# Patient Record
Sex: Female | Born: 1965 | Race: White | Hispanic: No | Marital: Married | Smoking: Current every day smoker
Health system: Southern US, Community
[De-identification: ages and names within clinical notes are randomized; demographics above are authoritative.]

## PROBLEM LIST (undated history)

## (undated) ENCOUNTER — Emergency Department: Payer: Self-pay

---

## 2003-10-17 ENCOUNTER — Emergency Department (HOSPITAL_COMMUNITY): Admission: EM | Admit: 2003-10-17 | Discharge: 2003-10-18 | Payer: Self-pay | Admitting: *Deleted

## 2004-12-05 HISTORY — PX: TOTAL VAGINAL HYSTERECTOMY: SHX2548

## 2004-12-15 ENCOUNTER — Observation Stay (HOSPITAL_COMMUNITY): Admission: RE | Admit: 2004-12-15 | Discharge: 2004-12-16 | Payer: Self-pay | Admitting: Obstetrics & Gynecology

## 2006-07-18 ENCOUNTER — Ambulatory Visit: Payer: Self-pay | Admitting: Gastroenterology

## 2006-08-24 ENCOUNTER — Ambulatory Visit (HOSPITAL_COMMUNITY): Admission: RE | Admit: 2006-08-24 | Discharge: 2006-08-24 | Payer: Self-pay | Admitting: Obstetrics and Gynecology

## 2006-09-29 ENCOUNTER — Ambulatory Visit: Payer: Self-pay | Admitting: Gastroenterology

## 2007-01-05 ENCOUNTER — Ambulatory Visit (HOSPITAL_COMMUNITY): Admission: RE | Admit: 2007-01-05 | Discharge: 2007-01-05 | Payer: Self-pay | Admitting: Gastroenterology

## 2007-01-05 ENCOUNTER — Encounter (INDEPENDENT_AMBULATORY_CARE_PROVIDER_SITE_OTHER): Payer: Self-pay | Admitting: Specialist

## 2007-07-15 IMAGING — US US ABDOMEN COMPLETE
1 series · 14 of 25 positions shown · non-contrast
Comparison: None.

CLINICAL DATA: Right upper quadrant pain.
 ABDOMEN ULTRASOUND:
TECHNIQUE: Complete abdominal ultrasound examination was performed including evaluation of the liver, gallbladder, bile ducts, pancreas, kidneys, spleen, IVC, and abdominal aorta.

[Series 1: unknown · 0.27mm/px · 14 of 75 slices shown]
[im 1/75]
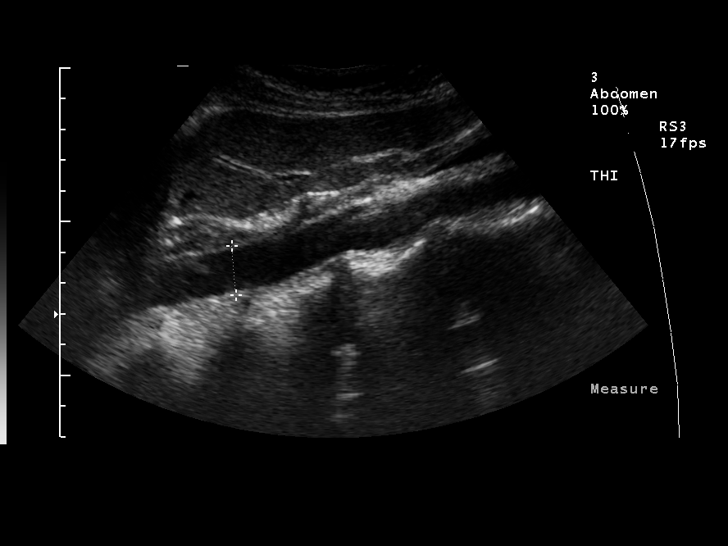
[im 7/75]
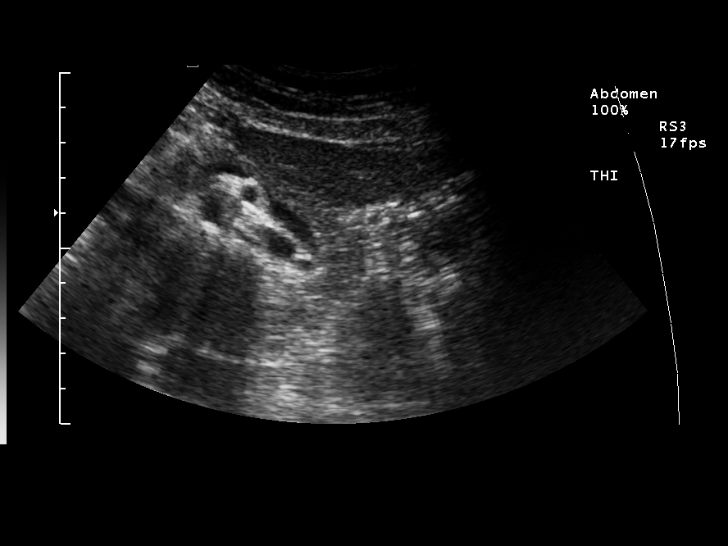
[im 13/75]
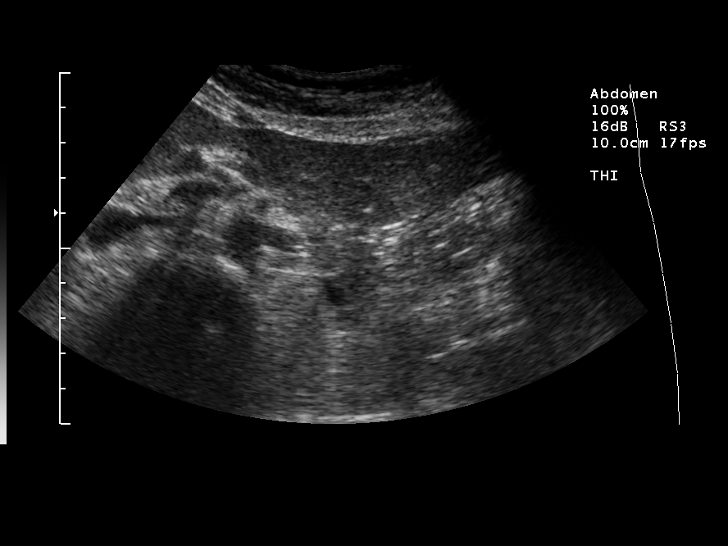
[im 19/75]
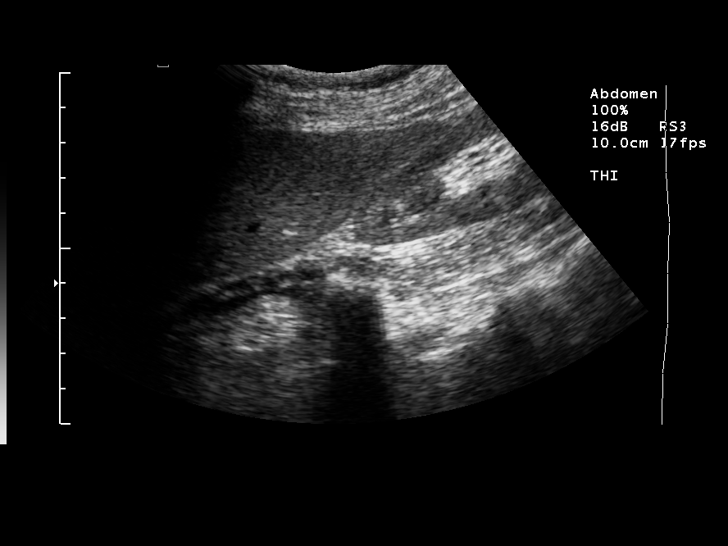
[im 25/75]
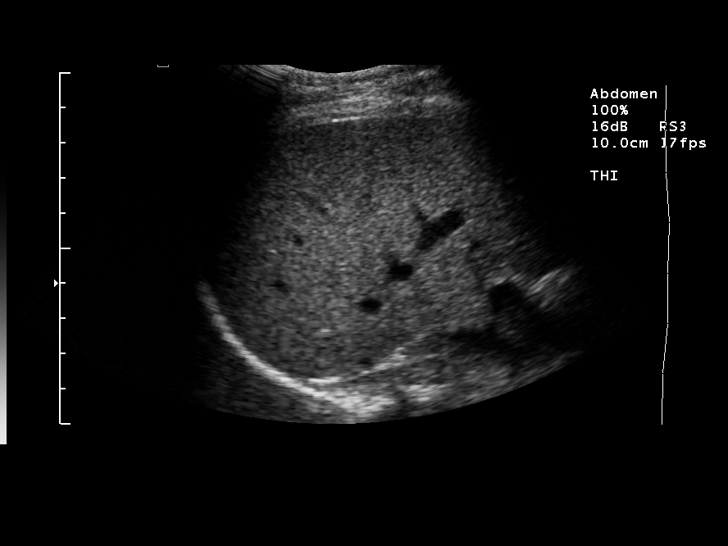
[im 28/75]
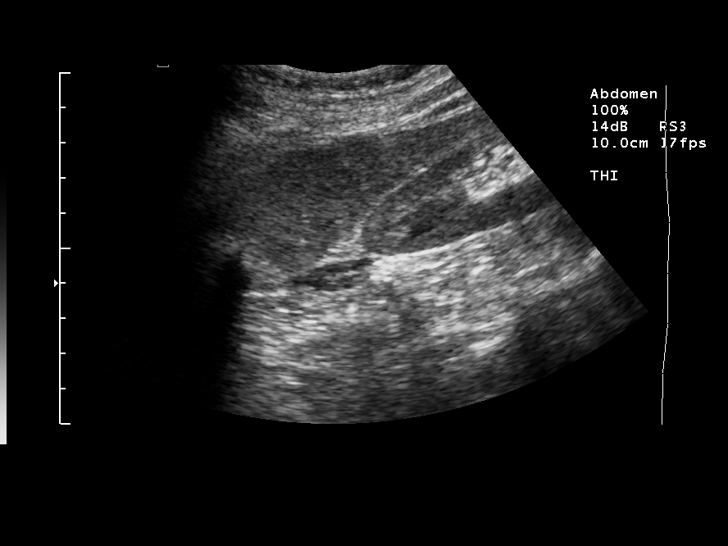
[im 34/75]
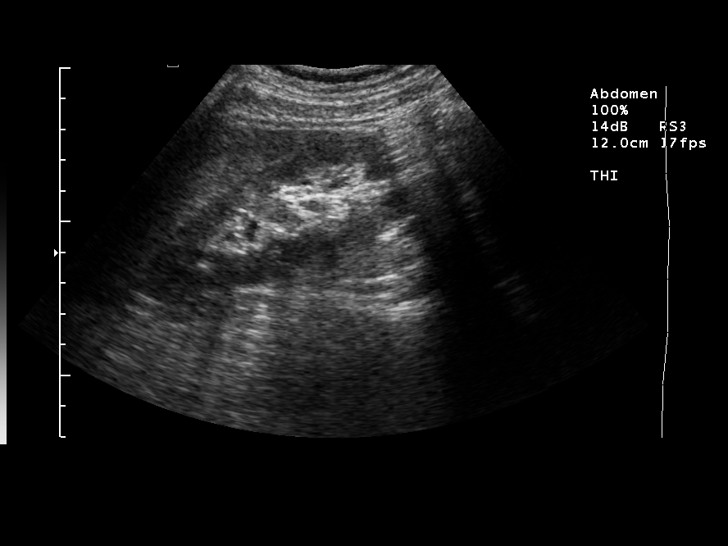
[im 41/75]
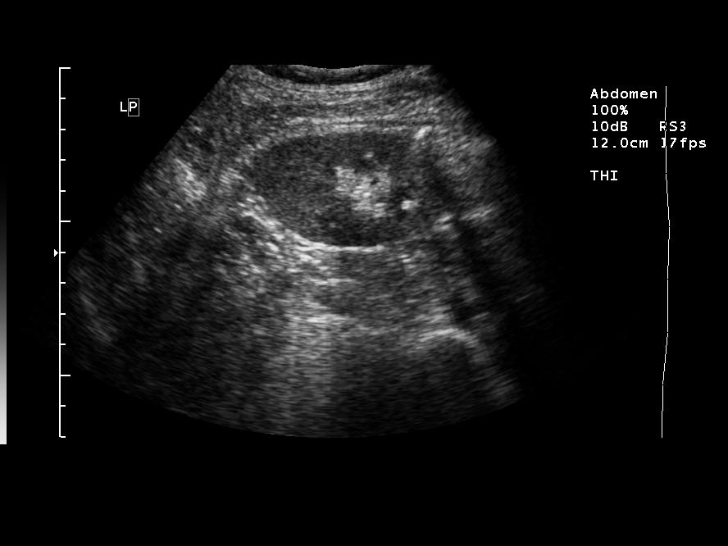
[im 47/75]
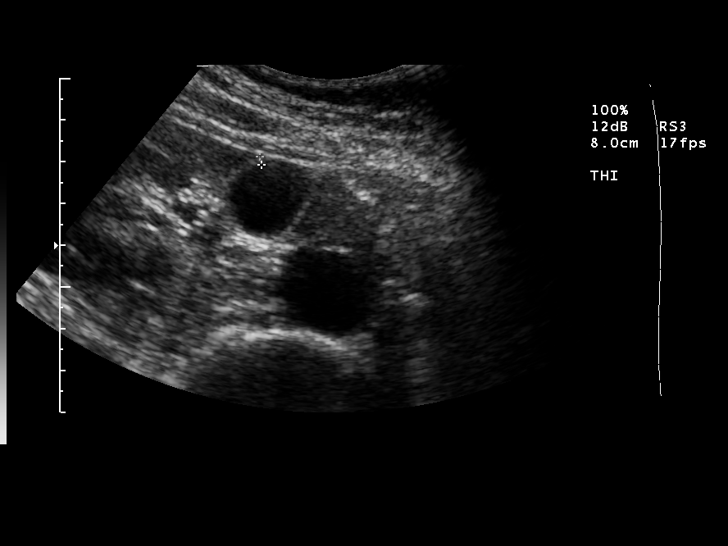
[im 50/75]
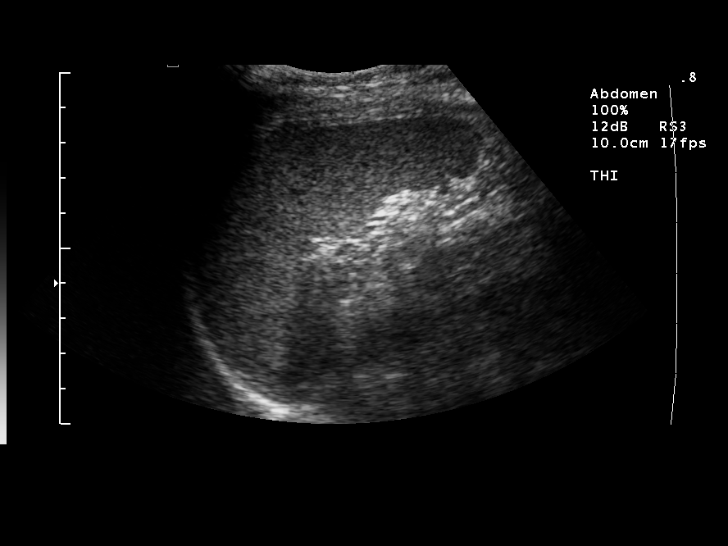
[im 56/75]
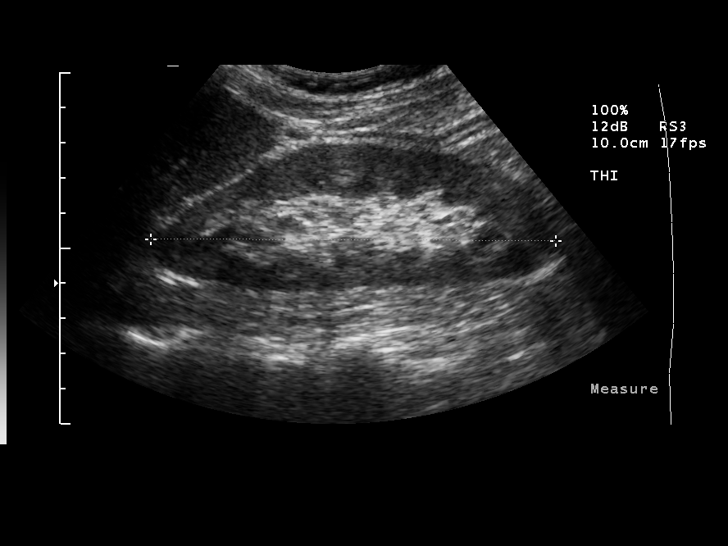
[im 62/75]
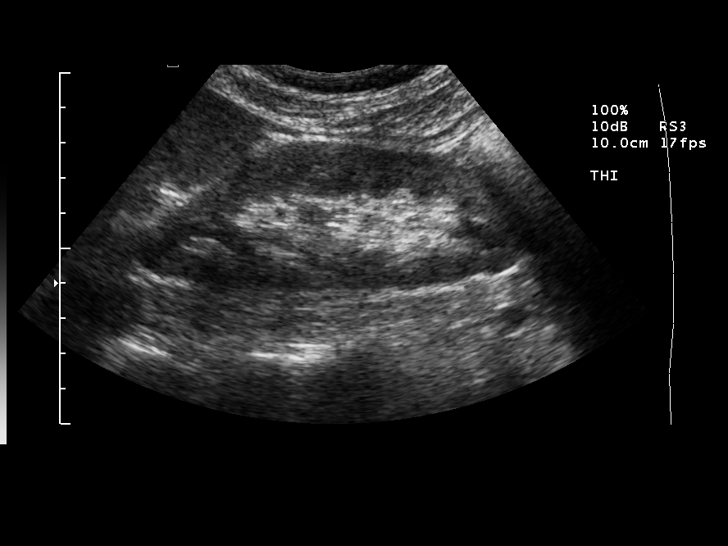
[im 68/75]
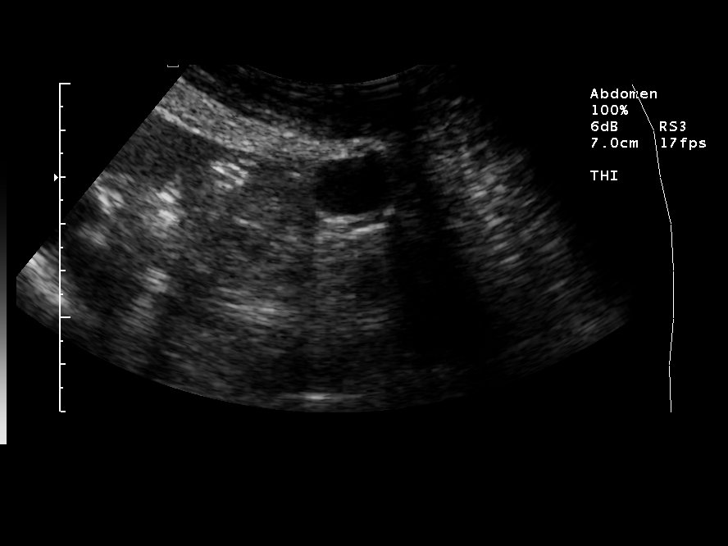
[im 75/75]
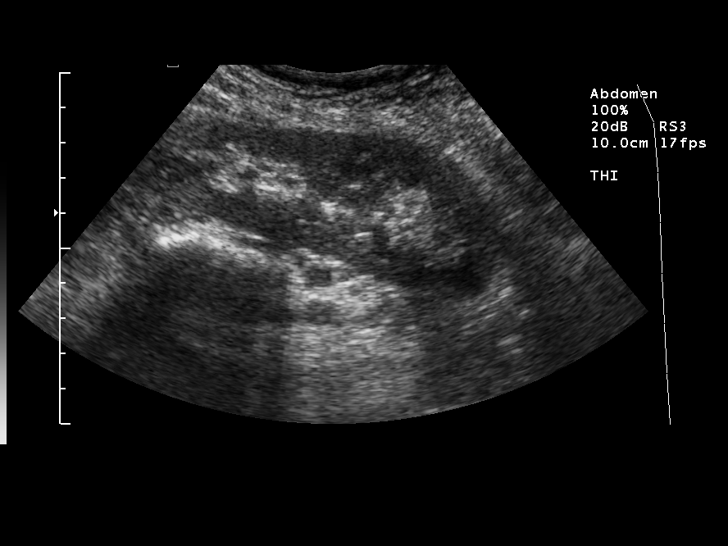

[14 of 25 positions shown; findings below may reference images not displayed]

FINDINGS: Gallbladder and biliary tree normal.  Common duct is 2.2 mm.  Liver, pancreas, and spleen normal.  Kidney size normal without stones or obstruction.  IVC and aorta normal.  No ascites.
IMPRESSION: Normal exam.

## 2010-12-23 ENCOUNTER — Ambulatory Visit
Admission: RE | Admit: 2010-12-23 | Discharge: 2010-12-23 | Payer: Self-pay | Source: Home / Self Care | Attending: Gastroenterology | Admitting: Gastroenterology

## 2010-12-23 DIAGNOSIS — B182 Chronic viral hepatitis C: Secondary | ICD-10-CM

## 2011-03-17 ENCOUNTER — Ambulatory Visit: Payer: Self-pay | Admitting: Gastroenterology

## 2011-03-22 ENCOUNTER — Emergency Department (HOSPITAL_COMMUNITY)
Admission: EM | Admit: 2011-03-22 | Discharge: 2011-03-22 | Disposition: A | Discharge status: Home / Self Care | Payer: Worker's Compensation | Attending: Emergency Medicine | Admitting: Emergency Medicine

## 2011-03-22 DIAGNOSIS — W261XXA Contact with sword or dagger, initial encounter: Secondary | ICD-10-CM | POA: Insufficient documentation

## 2011-03-22 DIAGNOSIS — Y9269 Other specified industrial and construction area as the place of occurrence of the external cause: Secondary | ICD-10-CM | POA: Insufficient documentation

## 2011-03-22 DIAGNOSIS — S61209A Unspecified open wound of unspecified finger without damage to nail, initial encounter: Secondary | ICD-10-CM | POA: Insufficient documentation

## 2011-03-22 DIAGNOSIS — W260XXA Contact with knife, initial encounter: Secondary | ICD-10-CM | POA: Insufficient documentation

## 2011-04-22 NOTE — Op Note (Signed)
Gabriela Jackson                 ACCOUNT NO.:  [id number]   MEDICAL RECORD NO.:  [id number]          PATIENT TYPE:  AMB   LOCATION:  DAY                           FACILITY:  [facility]   PHYSICIAN:  Lazaro Arms, M.D.   DATE OF BIRTH:  [birth year]   DATE OF PROCEDURE:  12/15/2004  DATE OF DISCHARGE:                                 OPERATIVE REPORT   PREOPERATIVE DIAGNOSES:  1.  Menometrorrhagia.  2.  Dysmenorrhea.  3.  Left Bartholin's gland cyst.   POSTOPERATIVE DIAGNOSES:  1.  Menometrorrhagia.  2.  Dysmenorrhea.  3.  Left Bartholin's gland cyst.   PROCEDURES:  1.  Vaginal hysterectomy.  2.  Excision of left Bartholin gland cyst.   SURGEON:  Lazaro Arms, M.D.   ANESTHESIA:  General endotracheal.   FINDINGS:  The patient had what appeared to be a normal uterus, may have  been boggy, had adenomyosis.  The ovaries were normal.  Tubes were normal.  She did have a noninfected left Bartholin gland cyst, and that was confirmed  at the time of surgery.  She had some clear mucoid material coming out of  it, but it was not infected.   DESCRIPTION OF OPERATION:  The patient was taken to the operating room and  placed in the supine position, where she underwent general endotracheal  anesthesia.  She was placed in the dorsal lithotomy position.  The lower  abdomen, inner thighs, perineum, vagina, and perianal area were prepped, and  she was draped in the usual sterile fashion.  A weighted speculum was  placed.  Her cervix was grasped with two thyroid tenacula.  Marcaine 0.5%  was injected about the cervix in a circumferential fashion without  difficulty.  The electrocautery unit was used and the vagina was incised,  and the vagina was pushed off the lower uterine segment both anteriorly and  posteriorly without difficulty.  The posterior cul-de-sac was entered  without difficulty.  The uterosacral ligaments were clamped, cut, and suture  ligated and held.  The cardinal ligaments  were clamped, cut, and suture  ligated and cut.  The anterior cul-de-sac was then dissected.  It was very  thin and attenuated.  The peritoneum was located, but she had I thought a  very thin tissue between peritoneum and bladder.  As a result, at the end of  the case I did sterile milk to evaluate the competency of the bladder, and  there was no spillage seen.  In any event, the anterior leaf of the broad  ligament and posterior leaf were plicated and the uterine vessels were  clamped, cut, and suture ligated.  Serial pedicles were taken off the fundus  off the uterus, each pedicle being clamped, cut and transfixed and suture  ligated.  The utero-ovarian ligaments were crossclamped, double suture  ligated, and the specimen was removed.  The bladder peritoneum was then  oversewn to protect the area from any postoperative necrosis or fistula  formation.  The peritoneum was then closed in a pursestring fashion.  The  anterior and posterior vagina were closed  in interrupted fashion without  difficulty.  There was 100 mL of blood loss for the vaginal hysterectomy.  Attention was then turned to the left Bartholin gland cyst.  An incision was  made of the inner vulvar surface and the Bartholin cyst was dissected out  both bluntly and sharply.  Electrocautery unit was used on the back side to  diminish blood loss.  There was approximately 50 mL of blood loss for  removal of the Bartholin gland cyst.  Three layers of suture to close the  dead space were placed.  Hemostasis was achieved both with suture and  electrocautery unit.  The skin was closed using 3-0 Monocryl sutures.  The  patient tolerated the procedure well.  She experienced 100 mL of total blood  loss.  She was taken to the recovery room in good, stable condition, all  counts correct x3.  She received Ancef prophylactically.  I did put a  pudendal block on the left side as well after the Bartholin gland cyst was  removed.      [name]   LHE/MEDQ  D:  12/15/2004  T:  12/15/2004  Job:  [id number]

## 2011-04-22 NOTE — H&P (Signed)
NAME:  Gabriela Jackson, Gabriela Jackson                 ACCOUNT NO.:  [id number]   MEDICAL RECORD NO.:  [id number]          PATIENT TYPE:  AMB   LOCATION:  DAY                           FACILITY:  [facility]   PHYSICIAN:  Lazaro Arms, M.D.   DATE OF BIRTH:  01-May-1966   DATE OF ADMISSION:  DATE OF DISCHARGE:  LH                                HISTORY & PHYSICAL   HISTORY OF PRESENT ILLNESS:  Gabriela Jackson is a 45 year old [name] female, gravida 4,  para 3, abortus 1 who is admitted for vaginal hysterectomy and also excision  of a left Bartholin's gland cyst.  The patient was seen in our office  originally on October 08, 2004 at which time the patient related she was  having quite heavy periods, irregular and heavy cramping with her period.  Vaginal prep ultrasound in the office on November 11 revealed a normal size  uterus, normal endometrial stripe.  Both ovaries were normal and there was  free fluid in the cul-de-sac.  Additionally, on exam the patient had a  Bartholin's gland cyst that was asymptomatic.  On pelvic exam, the patient  was tender in mid plane but not in the adnexa.  My impression at that time  was that this represented uterine pain, possibly adenomyosis.  We discussed  treatment options and the patient decided to go for vaginal hysterectomy and  removal of the left Bartholin's gland cyst.   PAST MEDICAL HISTORY:  Negative.   PAST SURGICAL HISTORY:  Negative.   OB HISTORY:  Three vaginal deliveries and one pregnancy loss.   REVIEW OF SYSTEMS:  Otherwise negative.   SOCIAL HISTORY:  The patient does smoke a pack of cigarettes a day.  No  drugs or alcohol.   MEDICATIONS:  Tylenol P.M.   ALLERGIES:  CODEINE causes nausea and vomiting.   PHYSICAL EXAMINATION:  VITAL SIGNS: The patient's weight 110 pounds.  HEENT:  Unremarkable.  NECK:  Thyroid normal.  LUNGS:  Clear.  HEART:  Regular rate and rhythm.  No murmur, rub, or gallop.  BREASTS:  Without mass or discharge or skin changes.  ABDOMEN:   Benign.  No hepatosplenomegaly or masses.  PELVIC:  She has left Bartholin's gland cyst in the left vulva.  The vagina  is pink, moist, no discharge.  Cervix parous, no lesions.  Uterus normal  size, shape and contour.  Tender to palpation.  Adnexa is negative.  EXTREMITIES:  Warm with no edema.  NEUROLOGIC:  Grossly intact.   IMPRESSION:  1.  Menometrorrhagia.  2.  Dysmenorrhea.  3.  Left Bartholin's gland cyst.   PLAN:  The patient is admitted for vaginal hysterectomy, excision of left  Bartholin's gland cyst.  She understands the risks, benefits, indications,  alternatives and will proceed.     [name]   LHE/MEDQ  D:  12/14/2004  T:  12/15/2004  Job:  [id number]

## 2012-06-08 DIAGNOSIS — B182 Chronic viral hepatitis C: Secondary | ICD-10-CM | POA: Insufficient documentation

## 2013-02-22 ENCOUNTER — Encounter: Payer: Self-pay | Admitting: *Deleted

## 2013-02-22 DIAGNOSIS — M797 Fibromyalgia: Secondary | ICD-10-CM | POA: Insufficient documentation

## 2013-02-22 DIAGNOSIS — G4700 Insomnia, unspecified: Secondary | ICD-10-CM

## 2013-02-22 DIAGNOSIS — B182 Chronic viral hepatitis C: Secondary | ICD-10-CM | POA: Insufficient documentation

## 2013-02-25 ENCOUNTER — Encounter: Payer: Self-pay | Admitting: Family Medicine

## 2013-02-25 ENCOUNTER — Ambulatory Visit (INDEPENDENT_AMBULATORY_CARE_PROVIDER_SITE_OTHER): Payer: 59 | Admitting: Family Medicine

## 2013-02-25 VITALS — BP 110/70 | HR 70 | Wt 121.6 lb

## 2013-02-25 DIAGNOSIS — Z8739 Personal history of other diseases of the musculoskeletal system and connective tissue: Secondary | ICD-10-CM

## 2013-02-25 DIAGNOSIS — F489 Nonpsychotic mental disorder, unspecified: Secondary | ICD-10-CM

## 2013-02-25 DIAGNOSIS — F411 Generalized anxiety disorder: Secondary | ICD-10-CM

## 2013-02-25 DIAGNOSIS — F5105 Insomnia due to other mental disorder: Secondary | ICD-10-CM

## 2013-02-25 DIAGNOSIS — F419 Anxiety disorder, unspecified: Secondary | ICD-10-CM

## 2013-02-25 MED ORDER — TRAZODONE HCL 50 MG PO TABS: ORAL_TABLET | ORAL | Status: DC

## 2013-02-25 NOTE — Progress Notes (Signed)
  Subjective:    Patient ID: Gabriela Jackson, female    DOB: 22-Dec-1965, 47 y.o.   MRN: [id number]  HPI Trouble sleeping at night. Ongoing challenges with diminished energy. Fibromyalgia acts up at times. Unfortunately not exercising regularly. Has history of hepatitis C. Had a liver biopsy but did not go back to discuss results.  Currently under care for hep c. Review of Systems  Constitutional: Positive for fatigue.  Musculoskeletal: Positive for myalgias and arthralgias.  Neurological: Positive for dizziness.  All other systems reviewed and are negative.       Objective:   Physical Exam  Vitals reviewed.  Alert no acute distress. HEENT normal. Lungs clear. Heart regular rate and rhythm. Neck supple. Ankles without edema. Neuro intact.       Assessment & Plan:  Impression #1 insomnia ongoing. #2 fibromyalgia certainly a challenge. #3 hepatitis C. Plan patient encouraged to get back with GI specialist. Medications refilled. Diet and exercise discussed in encourage.

## 2013-08-26 ENCOUNTER — Other Ambulatory Visit: Payer: Self-pay | Admitting: Family Medicine

## 2014-03-05 ENCOUNTER — Other Ambulatory Visit: Payer: Self-pay | Admitting: Family Medicine

## 2014-08-23 ENCOUNTER — Other Ambulatory Visit: Payer: Self-pay | Admitting: Family Medicine

## 2014-08-25 ENCOUNTER — Other Ambulatory Visit: Payer: Self-pay | Admitting: Family Medicine

## 2014-08-25 NOTE — Telephone Encounter (Signed)
One mo only as noted

## 2014-08-25 NOTE — Telephone Encounter (Signed)
Not seen one and a half yr one mo only needs ov

## 2014-08-28 ENCOUNTER — Ambulatory Visit: Payer: 59 | Admitting: Family Medicine

## 2014-09-11 ENCOUNTER — Ambulatory Visit (INDEPENDENT_AMBULATORY_CARE_PROVIDER_SITE_OTHER): Payer: 59 | Admitting: Family Medicine

## 2014-09-11 ENCOUNTER — Encounter: Payer: Self-pay | Admitting: Family Medicine

## 2014-09-11 VITALS — BP 120/70 | Ht 66.0 in | Wt 114.0 lb

## 2014-09-11 DIAGNOSIS — G4700 Insomnia, unspecified: Secondary | ICD-10-CM

## 2014-09-11 DIAGNOSIS — Z23 Encounter for immunization: Secondary | ICD-10-CM

## 2014-09-11 DIAGNOSIS — M797 Fibromyalgia: Secondary | ICD-10-CM

## 2014-09-11 MED ORDER — TRAZODONE HCL 50 MG PO TABS: ORAL_TABLET | ORAL | Status: DC

## 2014-09-11 NOTE — Progress Notes (Signed)
   Subjective:    Patient ID: Gabriela Jackson, female    DOB: [birth year], 48 y.o.   MRN: [id number]  HPI Patient is here for a follow up visit on her insomnia. Currently taking Trazodone with significant results. Patient states that she starts treatment for her Hepatitis on Tuesday morning.   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   fibromyalgia acting up at times, not always getting rest  90 per cent , cut down on smoling  No alcohol intake  Fairly good at seeping with the trazodone, no obvious morning side effects.  Review of Systems No headache no chest pain no abdominal pain no change about habits no blood in stool    Objective:   Physical Exam Alert no apparent distress. HEENT normal neck supple. Lungs clear. Heart rare in rhythm. Blood pressure good on repeat.       Assessment & Plan:  Impression 1 insomnia good control her trazodone #2 fibromyalgia clinically stable at this time. #3 chronic hepatitis C discussed patient to embark on curative therapy shortly. Plan flu vaccine. Refill medications. Diet exercise discussed. WSL

## 2014-10-20 ENCOUNTER — Other Ambulatory Visit: Payer: Self-pay

## 2014-10-20 ENCOUNTER — Ambulatory Visit (INDEPENDENT_AMBULATORY_CARE_PROVIDER_SITE_OTHER): Payer: 59 | Admitting: Family Medicine

## 2014-10-20 ENCOUNTER — Encounter: Payer: Self-pay | Admitting: Family Medicine

## 2014-10-20 VITALS — BP 124/70 | Temp 98.3°F | Ht 66.0 in | Wt 115.0 lb

## 2014-10-20 DIAGNOSIS — J310 Chronic rhinitis: Secondary | ICD-10-CM

## 2014-10-20 DIAGNOSIS — J329 Chronic sinusitis, unspecified: Secondary | ICD-10-CM

## 2014-10-20 MED ORDER — TRAZODONE HCL 50 MG PO TABS: ORAL_TABLET | ORAL | Status: DC

## 2014-10-20 MED ORDER — CEPHALEXIN 500 MG PO CAPS: 500.0000 mg | ORAL_CAPSULE | Freq: Three times a day (TID) | ORAL | Status: AC

## 2014-10-20 MED ORDER — ALBUTEROL SULFATE HFA 108 (90 BASE) MCG/ACT IN AERS: 2.0000 | INHALATION_SPRAY | Freq: Four times a day (QID) | RESPIRATORY_TRACT | Status: DC | PRN

## 2014-10-20 NOTE — Progress Notes (Signed)
   Subjective:    Patient ID: Gabriela Jackson, female    DOB: Sep 10, 1966, 48 y.o.   MRN: [id number]  Cough This is a new problem. The current episode started in the past 7 days. Associated symptoms include wheezing. Associated symptoms comments: congestion. She has tried nothing for the symptoms.    Started last firday,, Cough not wanting to go [place]  Now moving into chest  Feels heavy Non prod cough real bad  Has used inhaler in the past  Hx of this  Got the flu shot already  Doable on the hep c rx.Makes pt tired  Mood swings total twelve wk rx, half way thru it  Review of Systems  Respiratory: Positive for cough and wheezing.    No vomiting no diarrhea no rash    Objective:   Physical Exam  Alert mild malaise. Vital stable. HEENT moderate nasal congestion frontal tenderness pharynx slight erythema neck supple. Lungs clear. Heart regular rate and rhythm.      Assessment & Plan:  Impression acute rhinosinusitis plan antibiotics prescribed. Since Medicare discussed. Albuterol when necessary for wheeze. WSL

## 2014-11-03 ENCOUNTER — Ambulatory Visit: Payer: 59 | Admitting: Family Medicine

## 2015-05-15 ENCOUNTER — Other Ambulatory Visit: Payer: Self-pay | Admitting: Family Medicine

## 2015-05-15 NOTE — Telephone Encounter (Signed)
Last seen 10/20/14.

## 2015-05-15 NOTE — Telephone Encounter (Signed)
Needs office visit.

## 2015-05-15 NOTE — Telephone Encounter (Signed)
Ok times one, chronic o v due

## 2015-06-16 ENCOUNTER — Other Ambulatory Visit: Payer: Self-pay | Admitting: Family Medicine

## 2015-06-16 NOTE — Telephone Encounter (Signed)
Needs office visit.

## 2017-02-08 DIAGNOSIS — R05 Cough: Secondary | ICD-10-CM | POA: Diagnosis not present

## 2017-02-08 DIAGNOSIS — Z6822 Body mass index (BMI) 22.0-22.9, adult: Secondary | ICD-10-CM | POA: Diagnosis not present

## 2017-02-08 DIAGNOSIS — R21 Rash and other nonspecific skin eruption: Secondary | ICD-10-CM | POA: Diagnosis not present

## 2017-02-08 DIAGNOSIS — J301 Allergic rhinitis due to pollen: Secondary | ICD-10-CM | POA: Diagnosis not present

## 2017-02-08 DIAGNOSIS — M461 Sacroiliitis, not elsewhere classified: Secondary | ICD-10-CM | POA: Diagnosis not present

## 2017-02-08 DIAGNOSIS — M545 Low back pain: Secondary | ICD-10-CM | POA: Diagnosis not present

## 2017-04-10 DIAGNOSIS — Z1211 Encounter for screening for malignant neoplasm of colon: Secondary | ICD-10-CM | POA: Diagnosis not present

## 2017-05-08 DIAGNOSIS — Z1231 Encounter for screening mammogram for malignant neoplasm of breast: Secondary | ICD-10-CM | POA: Diagnosis not present

## 2017-10-17 DIAGNOSIS — R109 Unspecified abdominal pain: Secondary | ICD-10-CM | POA: Diagnosis not present

## 2017-10-17 DIAGNOSIS — R1011 Right upper quadrant pain: Secondary | ICD-10-CM | POA: Diagnosis not present

## 2017-10-17 DIAGNOSIS — Z1389 Encounter for screening for other disorder: Secondary | ICD-10-CM | POA: Diagnosis not present

## 2017-10-17 DIAGNOSIS — Z6824 Body mass index (BMI) 24.0-24.9, adult: Secondary | ICD-10-CM | POA: Diagnosis not present

## 2017-10-31 DIAGNOSIS — R109 Unspecified abdominal pain: Secondary | ICD-10-CM | POA: Diagnosis not present

## 2017-10-31 DIAGNOSIS — Z1389 Encounter for screening for other disorder: Secondary | ICD-10-CM | POA: Diagnosis not present

## 2019-10-10 ENCOUNTER — Other Ambulatory Visit: Payer: Self-pay

## 2019-10-10 DIAGNOSIS — Z20822 Contact with and (suspected) exposure to covid-19: Secondary | ICD-10-CM

## 2019-10-11 LAB — NOVEL CORONAVIRUS, NAA: SARS-CoV-2, NAA: DETECTED — AB

## 2020-01-08 ENCOUNTER — Encounter: Payer: Self-pay | Admitting: Family Medicine

## 2024-04-26 ENCOUNTER — Telehealth: Payer: Self-pay | Admitting: Family Medicine

## 2024-04-26 NOTE — Telephone Encounter (Signed)
 Wrong office, thanks.

## 2024-04-26 NOTE — Telephone Encounter (Signed)
 Copied from CRM [id number]. Topic: Appointments - Transfer of Care >> Apr 26, 2024 12:51 PM Kevelyn M wrote: Pt is requesting to transfer FROM: [facility] Pt is requesting to transfer TO: Trena Frieze Grooms Reason for requested transfer: Wanting to switch back to [facility] It is the responsibility of the team the patient would like to transfer to (Dr. Trena Frieze Grooms) to reach out to the patient if for any reason this transfer is not acceptable.

## 2024-07-01 ENCOUNTER — Encounter: Payer: Self-pay | Admitting: Physician Assistant

## 2024-07-01 ENCOUNTER — Ambulatory Visit (INDEPENDENT_AMBULATORY_CARE_PROVIDER_SITE_OTHER): Payer: Self-pay | Admitting: Physician Assistant

## 2024-07-01 VITALS — BP 135/78 | Ht 66.0 in | Wt 153.2 lb

## 2024-07-01 DIAGNOSIS — N941 Unspecified dyspareunia: Secondary | ICD-10-CM

## 2024-07-01 DIAGNOSIS — Z1329 Encounter for screening for other suspected endocrine disorder: Secondary | ICD-10-CM

## 2024-07-01 DIAGNOSIS — Z0001 Encounter for general adult medical examination with abnormal findings: Secondary | ICD-10-CM

## 2024-07-01 DIAGNOSIS — Z0000 Encounter for general adult medical examination without abnormal findings: Secondary | ICD-10-CM

## 2024-07-01 DIAGNOSIS — Z1211 Encounter for screening for malignant neoplasm of colon: Secondary | ICD-10-CM

## 2024-07-01 DIAGNOSIS — Z13220 Encounter for screening for lipoid disorders: Secondary | ICD-10-CM

## 2024-07-01 DIAGNOSIS — Z7689 Persons encountering health services in other specified circumstances: Secondary | ICD-10-CM

## 2024-07-01 DIAGNOSIS — Z1231 Encounter for screening mammogram for malignant neoplasm of breast: Secondary | ICD-10-CM

## 2024-07-01 MED ORDER — ESTRADIOL 0.1 MG/GM VA CREA: TOPICAL_CREAM | VAGINAL | 12 refills | Status: AC

## 2024-07-01 NOTE — Progress Notes (Signed)
 Complete physical exam  Patient: Gabriela Jackson   DOB: Nov 04, 1966   58 y.o. Female  MRN: [id number]  Subjective:    Chief Complaint  Patient presents with   Establish Care    COLLEEN KOTLARZ is a 58 y.o. female who presents today for a complete physical exam. She reports consuming a general diet. Patient reports she is active often but does not participate in regular physical activity. She generally feels fairly well. She reports sleeping well. She does have additional problems to discuss today.   Patient reports decreased libido and pain with intercourse. Has tried lubricants with little improvement. History of vaginal hysterectomy in 2006.  Most recent fall risk assessment:    07/01/2024   11:44 AM  Fall Risk   Falls in the past year? 0     Most recent depression screenings:    07/01/2024   11:05 AM  PHQ 2/9 Scores  PHQ - 2 Score 1  PHQ- 9 Score 7    Vision:Within last year and Dental: No current dental problems and Receives regular dental care  Patient Care Team: Dimitrios Balestrieri, Charmaine RIGGERS as PCP - General (Physician Assistant)   Outpatient Medications Prior to Visit  Medication Sig   traZODone  (DESYREL ) 150 MG tablet Take 150 mg by mouth daily.   [DISCONTINUED] albuterol  (PROVENTIL  HFA;VENTOLIN  HFA) 108 (90 BASE) MCG/ACT inhaler Inhale 2 puffs into the lungs every 6 (six) hours as needed for wheezing or shortness of breath.   [DISCONTINUED] traZODone  (DESYREL ) 50 MG tablet take 2 AND 1/2 tablet by mouth at bedtime   [DISCONTINUED] UNABLE TO FIND rivaverin Takes 4 qd Viekera. 3 qam and one at night.   No facility-administered medications prior to visit.    Review of Systems  Constitutional:  Negative for chills, fever and malaise/fatigue.  Eyes:  Negative for blurred vision and double vision.  Respiratory:  Negative for cough and shortness of breath.   Cardiovascular:  Negative for chest pain and palpitations.  Musculoskeletal:  Negative for joint pain and myalgias.   Neurological:  Negative for dizziness and headaches.  Psychiatric/Behavioral:  Negative for depression. The patient is not nervous/anxious.       Objective:     BP 135/78   Ht 5' 6 (1.676 m)   Wt 153 lb 3.2 oz (69.5 kg)   BMI 24.73 kg/m   Physical Exam Constitutional:      General: She is not in acute distress.    Appearance: Normal appearance. She is normal weight. She is not ill-appearing.  HENT:     Head: Normocephalic and atraumatic.     Mouth/Throat:     Mouth: Mucous membranes are moist.     Pharynx: Oropharynx is clear.  Eyes:     Extraocular Movements: Extraocular movements intact.     Conjunctiva/sclera: Conjunctivae normal.  Cardiovascular:     Rate and Rhythm: Normal rate and regular rhythm.     Heart sounds: Normal heart sounds. No murmur heard. Pulmonary:     Effort: Pulmonary effort is normal.     Breath sounds: Normal breath sounds. No wheezing or rales.  Musculoskeletal:     Right lower leg: No edema.     Left lower leg: No edema.  Skin:    General: Skin is warm and dry.  Neurological:     General: No focal deficit present.     Mental Status: She is alert and oriented to person, place, and time.  Psychiatric:  Mood and Affect: Mood normal.        Behavior: Behavior normal.      No results found for any visits on 07/01/24.    Assessment & Plan:    Routine Health Maintenance and Physical Exam  Health Maintenance  Topic Date Due   HIV Screening  Never done   DTaP/Tdap/Td vaccine (1 - Tdap) Never done   Pneumococcal Vaccination (1 of 2 - PCV) Never done   Pap with HPV screening  Never done   Colon Cancer Screening  Never done   Mammogram  Never done   Zoster (Shingles) Vaccine (1 of 2) Never done   Hepatitis B Vaccine (3 of 3 - 19+ 3-dose series) 11/26/2014   COVID-19 Vaccine (1 - 2024-25 season) Never done   Screening for Lung Cancer  07/01/2025*   Flu Shot  07/05/2024   Hepatitis C Screening  Completed   HPV Vaccine  Aged Out    Meningitis B Vaccine  Aged Out  *Topic was postponed. The date shown is not the original due date.    Discussed health benefits of physical activity, and encouraged her to engage in regular exercise appropriate for her age and condition.  Problem List Items Addressed This Visit   None Visit Diagnoses       Encounter to establish care    -  Primary     Annual visit for general adult medical examination without abnormal findings       Relevant Orders   CMP14+EGFR   CBC with Differential/Platelet     Dyspareunia in female       Relevant Medications   estradiol  (ESTRACE ) 0.1 MG/GM vaginal cream     Encounter for screening mammogram for malignant neoplasm of breast       Relevant Orders   MM 3D SCREENING MAMMOGRAM BILATERAL BREAST     Screen for colon cancer       Relevant Orders   Cologuard     Screening for lipid disorders       Relevant Orders   Lipid panel     Screening for thyroid disorder       Relevant Orders   TSH + free T4      Return in about 6 months (around 01/01/2025).  Safety measures discussed: wears seatbelt 100% of time Immunizations reviewed: due for pneumonia and shingles vaccinations Diet and exercise/ lifestyle modifications discussed: eats well, has increased water intake recently, does not exercise regularly  Recommend 150 minutes per week of exercise such as walking. Recommend lots of fresh produce to include fruits, vegetables, beans, healthy fats such as avocado, nuts, seeds, and 3-6 ounces of protein at each meal.  Avoid fried foods and fast food. Limit alcohol consumption: no more than one drink per day for women and 2 drinks per day for men.  Stress management discussed. Routine vision and dental screening discussed: recommend dentist every 6 months, gets vision checked every 1-2 years.  Health maintenance:  mammogram and Cologuard ordered today Questions answered.       Charmaine Maitri Schnoebelen, PA-C

## 2024-07-02 ENCOUNTER — Ambulatory Visit: Payer: Self-pay | Admitting: Physician Assistant

## 2024-07-02 ENCOUNTER — Other Ambulatory Visit: Payer: Self-pay | Admitting: Physician Assistant

## 2024-07-02 DIAGNOSIS — E782 Mixed hyperlipidemia: Secondary | ICD-10-CM

## 2024-07-02 LAB — CMP14+EGFR
ALT: 16 IU/L (ref 0–32)
AST: 13 IU/L (ref 0–40)
Albumin: 4.4 g/dL (ref 3.8–4.9)
Alkaline Phosphatase: 78 IU/L (ref 44–121)
BUN/Creatinine Ratio: 9 (ref 9–23)
BUN: 8 mg/dL (ref 6–24)
Bilirubin Total: 0.3 mg/dL (ref 0.0–1.2)
CO2: 21 mmol/L (ref 20–29)
Calcium: 9.2 mg/dL (ref 8.7–10.2)
Chloride: 105 mmol/L (ref 96–106)
Creatinine, Ser: 0.89 mg/dL (ref 0.57–1.00)
Globulin, Total: 2.7 g/dL (ref 1.5–4.5)
Glucose: 86 mg/dL (ref 70–99)
Potassium: 4.2 mmol/L (ref 3.5–5.2)
Sodium: 140 mmol/L (ref 134–144)
Total Protein: 7.1 g/dL (ref 6.0–8.5)
eGFR: 75 mL/min/1.73 (ref >59)

## 2024-07-02 LAB — CBC WITH DIFFERENTIAL/PLATELET
Basophils Absolute: 0.1 x10E3/uL (ref 0.0–0.2)
Basos: 1 %
EOS (ABSOLUTE): 0 x10E3/uL (ref 0.0–0.4)
Eos: 1 %
Hematocrit: 47.2 % — ABNORMAL HIGH (ref 34.0–46.6)
Hemoglobin: 15.6 g/dL (ref 11.1–15.9)
Immature Grans (Abs): 0 x10E3/uL (ref 0.0–0.1)
Immature Granulocytes: 0 %
Lymphocytes Absolute: 2 x10E3/uL (ref 0.7–3.1)
Lymphs: 36 %
MCH: 32.3 pg (ref 26.6–33.0)
MCHC: 33.1 g/dL (ref 31.5–35.7)
MCV: 98 fL — ABNORMAL HIGH (ref 79–97)
Monocytes Absolute: 0.4 x10E3/uL (ref 0.1–0.9)
Monocytes: 8 %
Neutrophils Absolute: 3 x10E3/uL (ref 1.4–7.0)
Neutrophils: 54 %
Platelets: 219 x10E3/uL (ref 150–450)
RBC: 4.83 x10E6/uL (ref 3.77–5.28)
RDW: 12.3 % (ref 11.7–15.4)
WBC: 5.5 x10E3/uL (ref 3.4–10.8)

## 2024-07-02 LAB — LIPID PANEL
Chol/HDL Ratio: 5.5 ratio — ABNORMAL HIGH (ref 0.0–4.4)
Cholesterol, Total: 275 mg/dL — ABNORMAL HIGH (ref 100–199)
HDL: 50 mg/dL (ref >39)
LDL Chol Calc (NIH): 201 mg/dL — ABNORMAL HIGH (ref 0–99)
Triglycerides: 132 mg/dL (ref 0–149)
VLDL Cholesterol Cal: 24 mg/dL (ref 5–40)

## 2024-07-02 LAB — TSH+FREE T4
Free T4: 1.09 ng/dL (ref 0.82–1.77)
TSH: 2.33 u[IU]/mL (ref 0.450–4.500)

## 2024-07-02 MED ORDER — ROSUVASTATIN CALCIUM 10 MG PO TABS: 10.0000 mg | ORAL_TABLET | Freq: Every day | ORAL | 3 refills | Status: DC

## 2024-07-03 ENCOUNTER — Encounter: Payer: Self-pay | Admitting: Physician Assistant

## 2024-07-04 ENCOUNTER — Other Ambulatory Visit: Payer: Self-pay

## 2024-07-04 DIAGNOSIS — E782 Mixed hyperlipidemia: Secondary | ICD-10-CM

## 2024-07-04 MED ORDER — ROSUVASTATIN CALCIUM 10 MG PO TABS: 10.0000 mg | ORAL_TABLET | Freq: Every day | ORAL | 3 refills | Status: AC

## 2024-10-29 ENCOUNTER — Other Ambulatory Visit: Payer: Self-pay | Admitting: Physician Assistant

## 2024-10-29 MED ORDER — TRAZODONE HCL 150 MG PO TABS: 150.0000 mg | ORAL_TABLET | Freq: Every day | ORAL | 1 refills | Status: DC

## 2024-10-29 NOTE — Telephone Encounter (Unsigned)
 Copied from CRM #[id number]. Topic: Clinical - Medication Refill >> Oct 29, 2024 12:38 PM Amber H wrote: Medication: traZODone  (DESYREL ) 150 MG tablet   Has the patient contacted their pharmacy? Yes, stated she did not have any refills left. Previous doctor who prescribed med is no longer with the practice.  (Agent: If no, request that the patient contact the pharmacy for the refill. If patient does not wish to contact the pharmacy document the reason why and proceed with request.) (Agent: If yes, when and what did the pharmacy advise?)  [facility] [address] [place] - [address] [place] [address] Phone: [phone] Fax: [phone]  Is this the correct pharmacy for this prescription? Yes If no, delete pharmacy and type the correct one.   Has the prescription been filled recently? Yes, July or August   Is the patient out of the medication? No, has 9 days left.   Has the patient been seen for an appointment in the last year OR does the patient have an upcoming appointment? Yes  Can we respond through MyChart? Yes or phone call.   Agent: Please be advised that Rx refills may take up to 3 business days. We ask that you follow-up with your pharmacy.

## 2024-12-28 ENCOUNTER — Other Ambulatory Visit: Payer: Self-pay | Admitting: Physician Assistant

## 2025-01-01 ENCOUNTER — Ambulatory Visit: Admitting: Physician Assistant

## 2025-01-06 ENCOUNTER — Ambulatory Visit: Admitting: Physician Assistant

## 2025-01-27 ENCOUNTER — Ambulatory Visit: Admitting: Physician Assistant
